# Patient Record
Sex: Female | Born: 2000 | Race: White | Hispanic: No | Marital: Single
Health system: Southern US, Academic
[De-identification: ages and names within clinical notes are randomized; demographics above are authoritative.]

## PROBLEM LIST (undated history)

## (undated) HISTORY — PX: APPENDECTOMY: SHX54

---

## 2020-07-17 ENCOUNTER — Emergency Department (HOSPITAL_COMMUNITY)
Admission: EM | Admit: 2020-07-17 | Discharge: 2020-07-18 | Disposition: A | Discharge status: Home / Self Care | Payer: BLUE CROSS/BLUE SHIELD | Attending: Emergency Medicine | Admitting: Emergency Medicine

## 2020-07-17 ENCOUNTER — Encounter (HOSPITAL_COMMUNITY): Payer: Self-pay

## 2020-07-17 ENCOUNTER — Emergency Department (HOSPITAL_COMMUNITY): Payer: BLUE CROSS/BLUE SHIELD

## 2020-07-17 DIAGNOSIS — R519 Headache, unspecified: Secondary | ICD-10-CM | POA: Insufficient documentation

## 2020-07-17 DIAGNOSIS — R590 Localized enlarged lymph nodes: Secondary | ICD-10-CM | POA: Diagnosis not present

## 2020-07-17 DIAGNOSIS — Z20822 Contact with and (suspected) exposure to covid-19: Secondary | ICD-10-CM | POA: Insufficient documentation

## 2020-07-17 DIAGNOSIS — M542 Cervicalgia: Secondary | ICD-10-CM | POA: Insufficient documentation

## 2020-07-17 DIAGNOSIS — J029 Acute pharyngitis, unspecified: Secondary | ICD-10-CM | POA: Insufficient documentation

## 2020-07-17 LAB — RESP PANEL BY RT-PCR (FLU A&B, COVID) ARPGX2
Influenza A by PCR: NEGATIVE
Influenza B by PCR: NEGATIVE
SARS Coronavirus 2 by RT PCR: NEGATIVE

## 2020-07-17 LAB — CBC WITH DIFFERENTIAL/PLATELET
Abs Immature Granulocytes: 0.01 10*3/uL (ref 0.00–0.07)
Basophils Absolute: 0.1 10*3/uL (ref 0.0–0.1)
Basophils Relative: 1 %
Eosinophils Absolute: 0.2 10*3/uL (ref 0.0–0.5)
Eosinophils Relative: 3 %
HCT: 36.5 % (ref 36.0–46.0)
Hemoglobin: 11.5 g/dL — ABNORMAL LOW (ref 12.0–15.0)
Immature Granulocytes: 0 %
Lymphocytes Relative: 38 %
Lymphs Abs: 2.2 10*3/uL (ref 0.7–4.0)
MCH: 26.5 pg (ref 26.0–34.0)
MCHC: 31.5 g/dL (ref 30.0–36.0)
MCV: 84.1 fL (ref 80.0–100.0)
Monocytes Absolute: 0.6 10*3/uL (ref 0.1–1.0)
Monocytes Relative: 10 %
Neutro Abs: 2.8 10*3/uL (ref 1.7–7.7)
Neutrophils Relative %: 48 %
Platelets: 296 10*3/uL (ref 150–400)
RBC: 4.34 MIL/uL (ref 3.87–5.11)
RDW: 12.1 % (ref 11.5–15.5)
WBC: 5.7 10*3/uL (ref 4.0–10.5)
nRBC: 0 % (ref 0.0–0.2)

## 2020-07-17 LAB — URINALYSIS, ROUTINE W REFLEX MICROSCOPIC
Bilirubin Urine: NEGATIVE
Glucose, UA: NEGATIVE mg/dL
Hgb urine dipstick: NEGATIVE
Ketones, ur: NEGATIVE mg/dL
Nitrite: NEGATIVE
Protein, ur: NEGATIVE mg/dL
Specific Gravity, Urine: 1.008 (ref 1.005–1.030)
pH: 6 (ref 5.0–8.0)

## 2020-07-17 LAB — BASIC METABOLIC PANEL
Anion gap: 10 (ref 5–15)
BUN: 12 mg/dL (ref 6–20)
CO2: 25 mmol/L (ref 22–32)
Calcium: 9.7 mg/dL (ref 8.9–10.3)
Chloride: 103 mmol/L (ref 98–111)
Creatinine, Ser: 0.79 mg/dL (ref 0.44–1.00)
GFR, Estimated: >60 mL/min (ref >60)
Glucose, Bld: 96 mg/dL (ref 70–99)
Potassium: 4 mmol/L (ref 3.5–5.1)
Sodium: 138 mmol/L (ref 135–145)

## 2020-07-17 LAB — I-STAT BETA HCG BLOOD, ED (MC, WL, AP ONLY): I-stat hCG, quantitative: <5 m[IU]/mL (ref <5)

## 2020-07-17 MED ORDER — IOHEXOL 300 MG/ML  SOLN
75.0000 mL | Freq: Once | INTRAMUSCULAR | Status: AC | PRN
  Administered 2020-07-17: 75 mL via INTRAVENOUS

## 2020-07-17 MED ORDER — ACETAMINOPHEN 325 MG PO TABS
650.0000 mg | ORAL_TABLET | Freq: Once | ORAL | Status: AC
  Administered 2020-07-17: 650 mg via ORAL
  Filled 2020-07-17: qty 2

## 2020-07-17 NOTE — ED Provider Notes (Signed)
[facility] EMERGENCY DEPARTMENT Provider Note   CSN: [id number] Arrival date & time: 07/17/20  1911     History Chief Complaint  Patient presents with  . Fever  . Neck Pain    Desiree Johnson is a 19 y.o. female.  HPI Patient presents with sore throat headache and neck pain.  Reportedly over the last 2 weeks to a month has been dealing with sore throats.  Due to get her tonsils out in the month.  States that has been on 3 different antibiotics.  Has been on azithromycin cefdinir and now clindamycin.  Has been tested for Covid that was negative and reportedly tested for mono with that they initially thought was positive and then he said was a previous infection.  Sore throat pain or swelling.  Now states she is having more of a headache and sort of pain in her neck.  Some pain moving her neck.  States she talked to her ENT and was told to come in to rule out meningitis.  States she is been having fevers for 2 weeks.  States it will be anywhere from 99 up to almost 100.  Denies temperature over 100.5.  States the pain is hurting more to swallow.  Patient states she also had had an ultrasound done due to some swelling in her abdomen.  Reportedly also was positive for group C strep.    History reviewed. No pertinent past medical history.  There are no problems to display for this patient.   Past Surgical History:  Procedure Laterality Date  . APPENDECTOMY       OB History   No obstetric history on file.     No family history on file.  Social History   Tobacco Use  . Smoking status: Not on file  Substance Use Topics  . Alcohol use: Not on file  . Drug use: Not on file    Home Medications Prior to Admission medications   Medication Sig Start Date End Date Taking? Authorizing Provider  Adapalene-Benzoyl Peroxide 0.1-2.5 % gel Apply topically daily. 04/28/20   [provider]  cefdinir (OMNICEF) 300 MG capsule TAKE ONE CAPSULE BY MOUTH TWICE DAILY  FOR SEVEN DAYS 06/30/20   [provider]  clindamycin (CLEOCIN) 150 MG capsule Take 150 mg by mouth every 8 (eight) hours. 07/11/20   [provider]  LO LOESTRIN FE 1 MG-10 MCG / 10 MCG tablet Take 1 tablet by mouth daily. 06/24/20   [provider]    Allergies    Latex  Review of Systems   Review of Systems  Constitutional: Positive for appetite change, fatigue and fever.  HENT: Positive for sore throat. Negative for congestion and voice change.        Pain with swallowing.  Respiratory: Negative for shortness of breath.   Cardiovascular: Negative for leg swelling.  Gastrointestinal: Negative for abdominal pain.  Genitourinary: Negative for flank pain.  Musculoskeletal: Negative for back pain.  Skin: Negative for rash.  Neurological: Positive for headaches. Negative for weakness and numbness.  Psychiatric/Behavioral: Negative for confusion.    Physical Exam Updated Vital Signs BP 116/73   Pulse 77   Temp 98.1 F (36.7 C)   Resp 17   SpO2 98%   Physical Exam Vitals reviewed.  Constitutional:      Appearance: Normal appearance.  HENT:     Head: Normocephalic.     Mouth/Throat:     Mouth: Mucous membranes are moist.  Comments: Bilateral tonsils swollen.  No peritonsillar swelling.  Normal voice.  Uvula midline. Eyes:     Extraocular Movements: Extraocular movements intact.  Cardiovascular:     Rate and Rhythm: Normal rate and regular rhythm.  Pulmonary:     Breath sounds: No wheezing or rhonchi.  Abdominal:     Tenderness: There is no abdominal tenderness.  Musculoskeletal:        General: No tenderness.     Cervical back: Normal range of motion and neck supple. No rigidity.  Lymphadenopathy:     Cervical: Cervical adenopathy present.  Skin:    General: Skin is warm.     Capillary Refill: Capillary refill takes less than 2 seconds.  Neurological:     Mental Status: She is alert and oriented to person, place, and time.    Psychiatric:        Mood and Affect: Mood normal.     ED Results / Procedures / Treatments   Labs (all labs ordered are listed, but only abnormal results are displayed) Labs Reviewed  CBC WITH DIFFERENTIAL/PLATELET - Abnormal; Notable for the following components:      Result Value   Hemoglobin 11.5 (*)    All other components within normal limits  URINALYSIS, ROUTINE W REFLEX MICROSCOPIC - Abnormal; Notable for the following components:   APPearance HAZY (*)    Leukocytes,Ua TRACE (*)    Bacteria, UA MANY (*)    All other components within normal limits  RESP PANEL BY RT-PCR (FLU A&B, COVID) ARPGX2  BASIC METABOLIC PANEL  I-STAT BETA HCG BLOOD, ED ([facility], [facility], [facility] ONLY)    EKG None  Radiology CT Soft Tissue Neck W Contrast  Result Date: 07/17/2020 CLINICAL DATA:  Fever with neck stiffness. EXAM: CT NECK WITH CONTRAST TECHNIQUE: Multidetector CT imaging of the neck was performed using the standard protocol following the bolus administration of intravenous contrast. CONTRAST:  4mL OMNIPAQUE IOHEXOL 300 MG/ML  SOLN COMPARISON:  None. FINDINGS: PHARYNX AND LARYNX: The nasopharynx, oropharynx and larynx are normal. Visible portions of the oral cavity, tongue base and floor of mouth are normal. Normal epiglottis, vallecula and pyriform sinuses. The larynx is normal. No retropharyngeal abscess, effusion or lymphadenopathy. SALIVARY GLANDS: Normal parotid, submandibular and sublingual glands. THYROID: Normal. LYMPH NODES: 10 mm bilateral level 2A lymph nodes. VASCULAR: Major cervical vessels are patent. LIMITED INTRACRANIAL: Normal. VISUALIZED ORBITS: Normal. MASTOIDS AND VISUALIZED PARANASAL SINUSES: No fluid levels or advanced mucosal thickening. No mastoid effusion. SKELETON: No bony spinal canal stenosis. No lytic or blastic lesions. UPPER CHEST: Clear. OTHER: None. IMPRESSION: 1. No acute abnormality of the neck. 2. Mildly enlarged bilateral level 2A lymph nodes, likely reactive.  Electronically Signed   By: Deatra Robinson M.D.   On: 07/17/2020 23:41    Procedures Procedures (including critical care time)  Medications Ordered in ED Medications  acetaminophen (TYLENOL) tablet 650 mg (650 mg Oral Given 07/17/20 2251)  iohexol (OMNIPAQUE) 300 MG/ML solution 75 mL (75 mLs Intravenous Contrast Given 07/17/20 2318)  dexamethasone (DECADRON) injection 10 mg (10 mg Intravenous Given 07/18/20 0014)    ED Course  I have reviewed the triage vital signs and the nursing notes.  Pertinent labs & imaging results that were available during my care of the patient were reviewed by me and considered in my medical decision making (see chart for details).    MDM Rules/Calculators/A&P  Patient sent in.  Has had sore throat for a while now.  Has been on and off antibiotics.  Due to have tonsils out.  Has had previous testing.  Reportedly positive for group C strep.  Reportedly sent in to rule out meningitis.  Patient has good motion at the neck.  No fever here.  No abnormal white count.  CT scan of the neck done due to lymph nodes continued pain.  Reassuring.  No peritonsillar abscess.  Clinically I do not think patient has a bacterial meningitis and I think also unlikely to have viral meningitis.  Reviewed previous mono testing and I think it was most likely consistent with previous infection.  Has not been treated with steroids.  Do not think we need to stop the antibiotics but will add some Decadron here.  Discharge home. Urine has some white cells and bacteria.  Has been urinating frequently but states she has done that her whole life with no change now.  Without new symptoms do not think we need treatment for that.  No abdominal tenderness.  Discharge home.  Final Clinical Impression(s) / ED Diagnoses Final diagnoses:  None    Rx / DC Orders ED Discharge Orders    None       Benjiman Core, MD 07/18/20 0021

## 2020-07-17 NOTE — ED Triage Notes (Addendum)
Pt arrives to ED w/ c/o fever, stiff neck, and headache x 2 weeks. Pt has chronic throat infections d/t tonsils and has been on ABX during this time, however, told by ENT to come to ER for meningitis workup. Pt states she had positive group C strep test 3 weeks ago. Pt reports 6/10 head/neck pain.

## 2020-07-18 MED ORDER — DEXAMETHASONE SODIUM PHOSPHATE 10 MG/ML IJ SOLN
10.0000 mg | Freq: Once | INTRAMUSCULAR | Status: AC
  Administered 2020-07-18: 10 mg via INTRAVENOUS
  Filled 2020-07-18: qty 1

## 2020-07-18 NOTE — ED Notes (Signed)
Discharge instructions provided to patient. Verbalized understanding. Alert and oriented. IV lock removed. Ambulated with steady gait with mother out of ED.

## 2020-07-18 NOTE — Discharge Instructions (Addendum)
Hopefully the steroids will help with some of the symptoms.  Follow-up with ear nose and throat.  You are afebrile here and have normal white count without a left shift.  Bacterial meningitis thought unlikely.

## 2022-01-16 IMAGING — CT CT NECK W/ CM
4 series · 15 of 33 positions shown, 18 images · IV contrast (APPLIED)
Comparison: None.

CLINICAL DATA: Fever with neck stiffness.

EXAM:
CT NECK WITH CONTRAST
TECHNIQUE: Multidetector CT imaging of the neck was performed using the
standard protocol following the bolus administration of intravenous
contrast.
CONTRAST:  75mL OMNIPAQUE IOHEXOL 300 MG/ML  SOLN

[Series 3: neck 2.0 i31s 3 · axial · 0.49mm/px · z∈[-202,-52]mm · 5 of 113 slices shown, 7 images]
[im 19/113  soft-tissue]
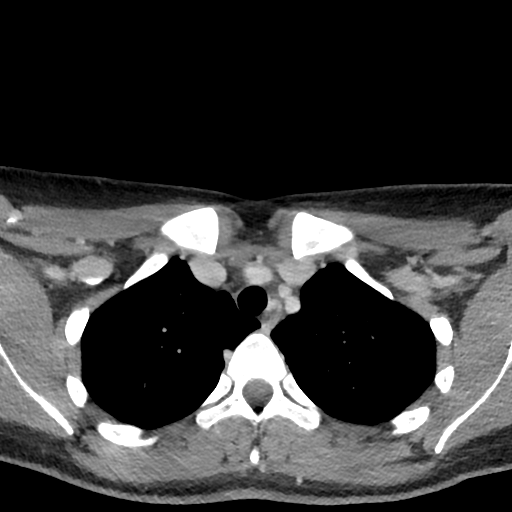
[im 19/113  bone]
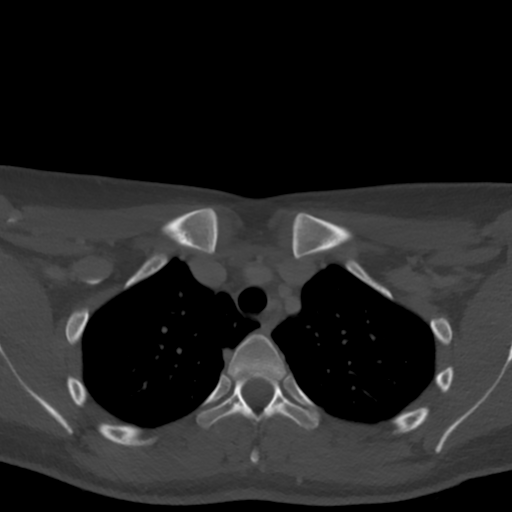
[im 38/113  bone]
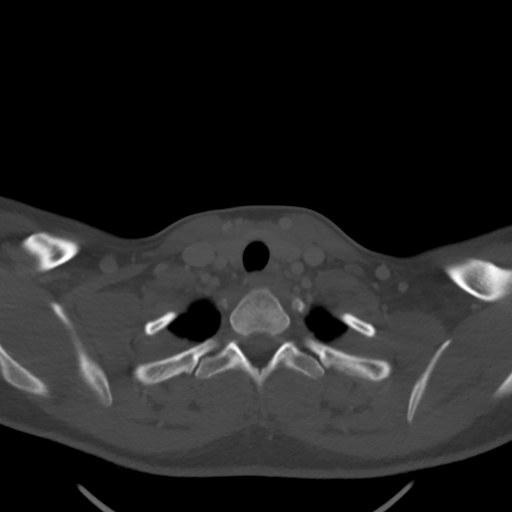
[im 57/113  bone]
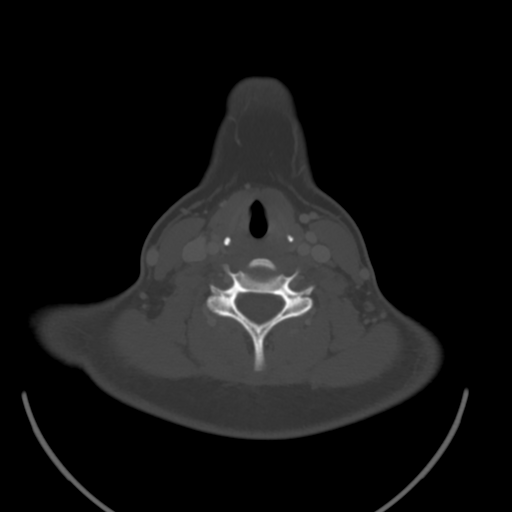
[im 75/113  bone]
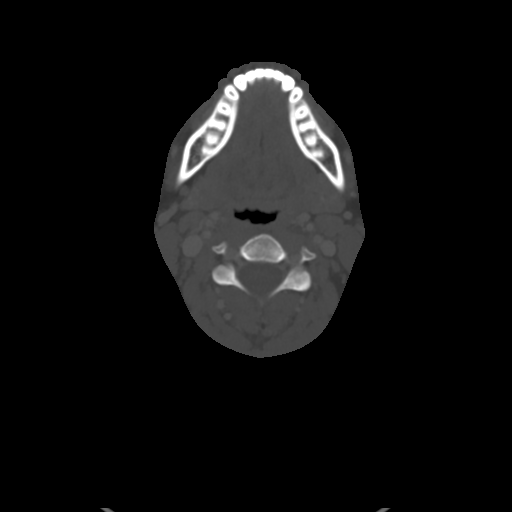
[im 94/113  soft-tissue]
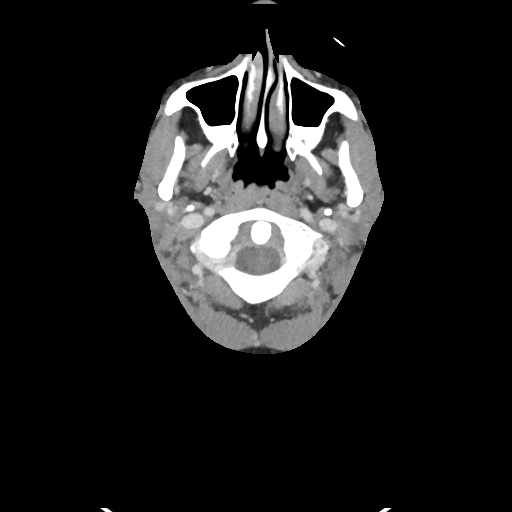
[im 94/113  bone]
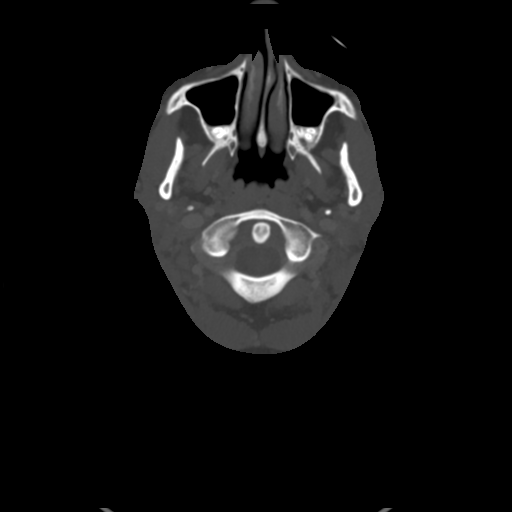

[Series 6: coronal st · coronal · 0.44mm/px · 3 of 122 slices shown]
[im 25/122  bone]
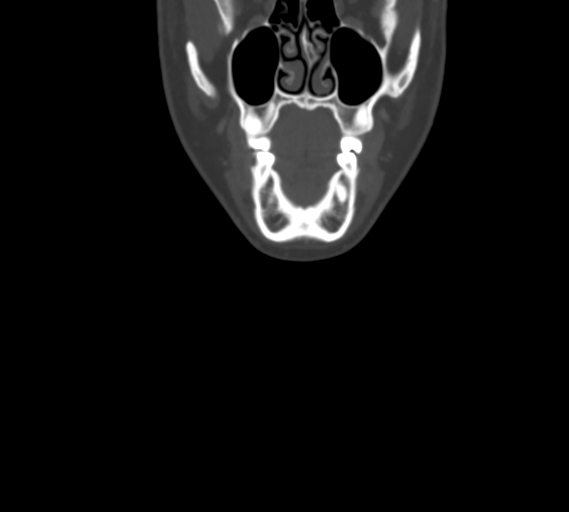
[im 49/122  bone]
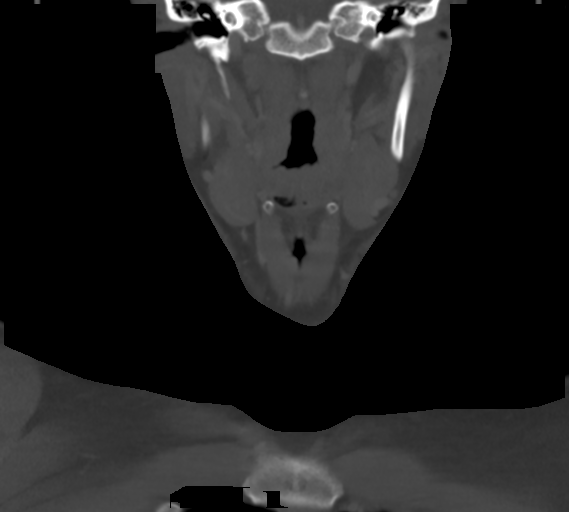
[im 73/122  bone]
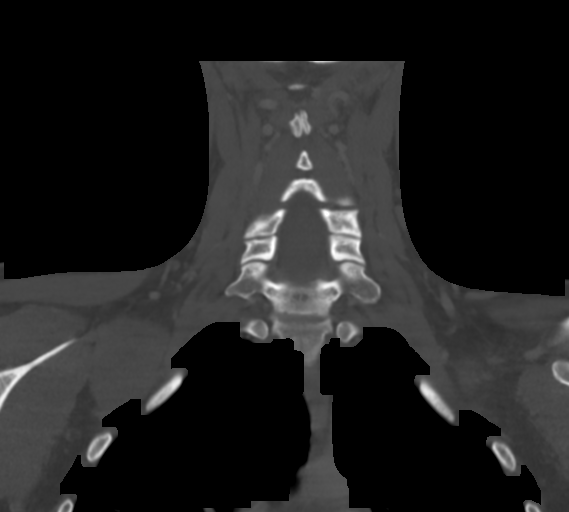

[Series 7: sagittal st · sagittal · 0.44mm/px · 5 of 101 slices shown, 6 images]
[im 34/101  bone]
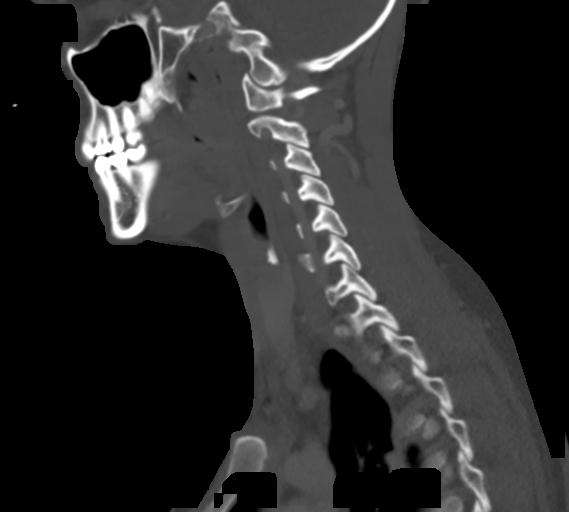
[im 42/101  bone]
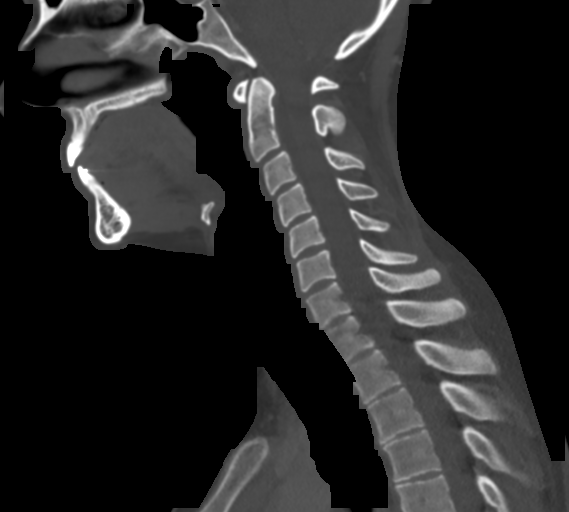
[im 51/101  soft-tissue]
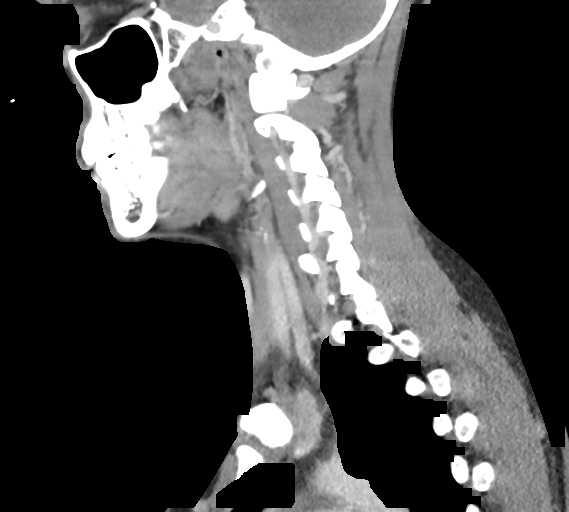
[im 51/101  bone]
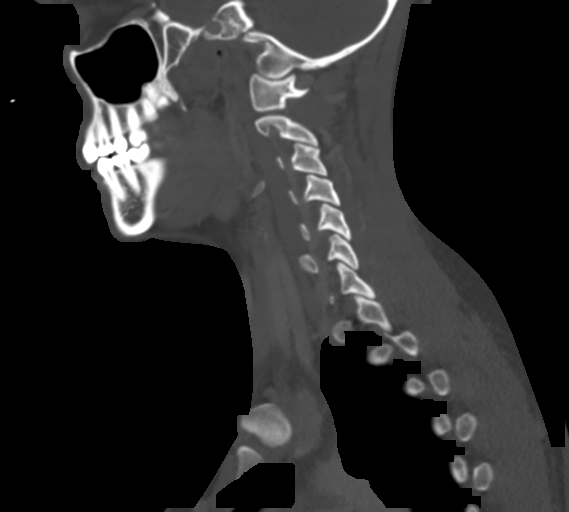
[im 59/101  bone]
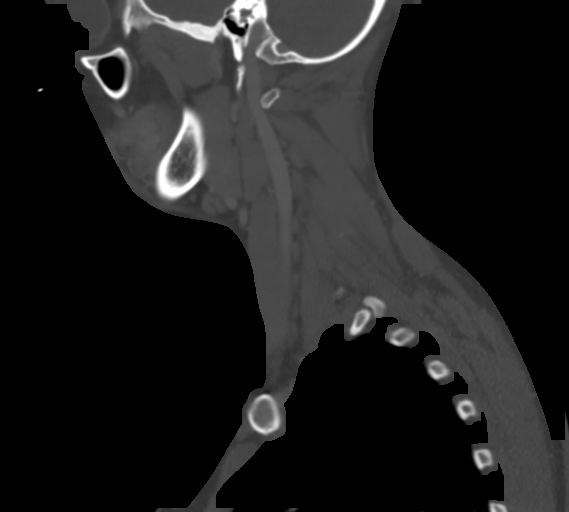
[im 67/101  bone]
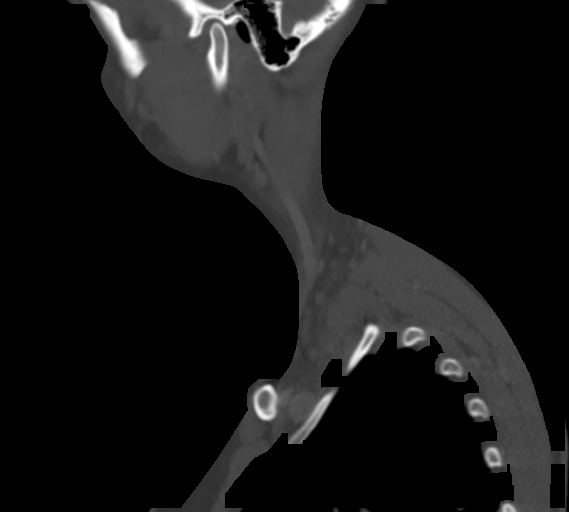

[Series 8: orthogonal st · axial · 0.39mm/px · z∈[-203,-165]mm · 2 of 114 slices shown]
[im 19/114  bone]
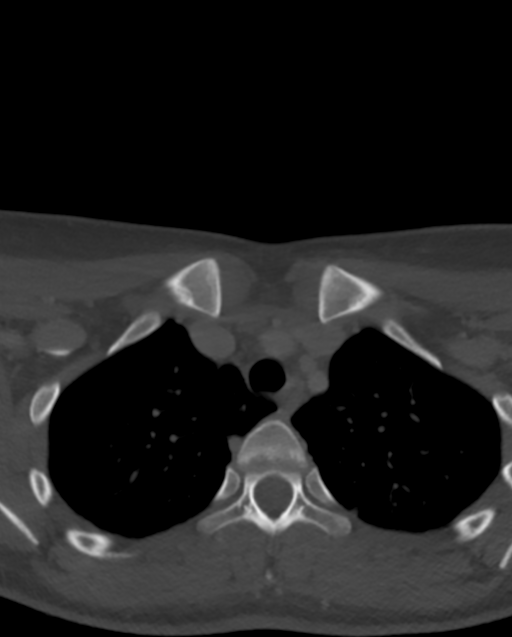
[im 38/114  bone]
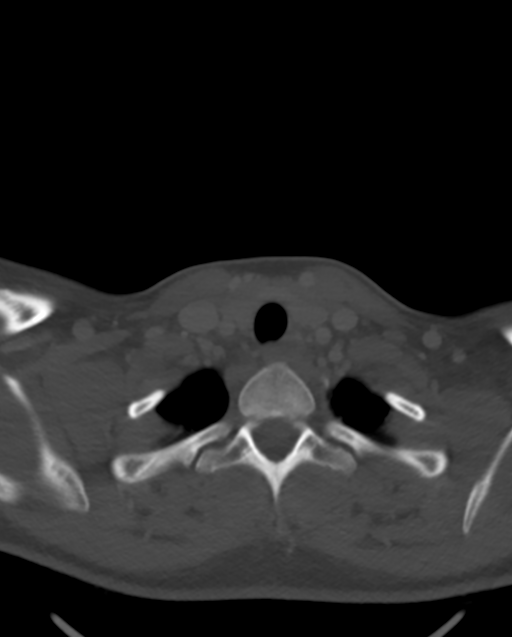

[15 of 33 positions shown; findings below may reference images not displayed]

FINDINGS: PHARYNX AND LARYNX: The nasopharynx, oropharynx and larynx are
normal. Visible portions of the oral cavity, tongue base and floor
of mouth are normal. Normal epiglottis, vallecula and pyriform
sinuses. The larynx is normal. No retropharyngeal abscess, effusion
or lymphadenopathy.

SALIVARY GLANDS: Normal parotid, submandibular and sublingual
glands.

THYROID: Normal.

LYMPH NODES: 10 mm bilateral level 2A lymph nodes.

VASCULAR: Major cervical vessels are patent.

LIMITED INTRACRANIAL: Normal.

VISUALIZED ORBITS: Normal.

MASTOIDS AND VISUALIZED PARANASAL SINUSES: No fluid levels or
advanced mucosal thickening. No mastoid effusion.

SKELETON: No bony spinal canal stenosis. No lytic or blastic
lesions.

UPPER CHEST: Clear.

OTHER: None.
IMPRESSION: 1. No acute abnormality of the neck.
2. Mildly enlarged bilateral level 2A lymph nodes, likely reactive.

## 2022-02-04 ENCOUNTER — Other Ambulatory Visit: Payer: Self-pay

## 2022-02-04 DIAGNOSIS — W19XXXA Unspecified fall, initial encounter: Secondary | ICD-10-CM

## 2022-02-04 DIAGNOSIS — S82832A Other fracture of upper and lower end of left fibula, initial encounter for closed fracture: Secondary | ICD-10-CM

## 2022-02-04 DIAGNOSIS — S82392A Other fracture of lower end of left tibia, initial encounter for closed fracture: Secondary | ICD-10-CM

## 2022-02-04 NOTE — ED Triage Notes (Signed)
"  I was wrestling around and come down on my ankle wrong" at 2200, left ankle pain/edema, unable to bear weight on LLE, verb "I've been drinking some today"

## 2022-02-05 ENCOUNTER — Emergency Department (HOSPITAL_COMMUNITY): Payer: BC Managed Care – PPO

## 2022-02-05 ENCOUNTER — Encounter (HOSPITAL_COMMUNITY): Payer: Self-pay

## 2022-02-05 ENCOUNTER — Emergency Department
Admission: EM | Admit: 2022-02-05 | Discharge: 2022-02-05 | Disposition: A | Discharge status: Home / Self Care | Payer: BC Managed Care – PPO | Attending: Physician Assistant | Admitting: Physician Assistant

## 2022-02-05 DIAGNOSIS — S82832A Other fracture of upper and lower end of left fibula, initial encounter for closed fracture: Secondary | ICD-10-CM | POA: Insufficient documentation

## 2022-02-05 DIAGNOSIS — S82309A Unspecified fracture of lower end of unspecified tibia, initial encounter for closed fracture: Secondary | ICD-10-CM

## 2022-02-05 DIAGNOSIS — S82392A Other fracture of lower end of left tibia, initial encounter for closed fracture: Secondary | ICD-10-CM | POA: Insufficient documentation

## 2022-02-05 DIAGNOSIS — W19XXXA Unspecified fall, initial encounter: Secondary | ICD-10-CM | POA: Insufficient documentation

## 2022-02-05 MED ORDER — IBUPROFEN 800 MG TABLET
800.0000 mg | ORAL_TABLET | ORAL | Status: AC
Start: 2022-02-05 — End: 2022-02-05
  Administered 2022-02-05: 800 mg via ORAL

## 2022-02-05 MED ORDER — HYDROCODONE 5 MG-ACETAMINOPHEN 325 MG TABLET
1.0000 | ORAL_TABLET | ORAL | 0 refills | Status: AC | PRN
Start: 2022-02-05 — End: 2022-02-08

## 2022-02-05 MED ORDER — IBUPROFEN 800 MG TABLET
ORAL_TABLET | ORAL | Status: AC
Start: 2022-02-05 — End: 2022-02-05
  Filled 2022-02-05: qty 1

## 2022-02-05 NOTE — Discharge Instructions (Signed)
You did sustain a fracture to your left tibia/fibula.  Keep the splint in place keep the leg elevated as much as possible.  A prescription for pain medication was written get this filled and take as needed.  Push fluids take rest.  It is very important that you follow-up with an orthopedist as soon as possible.  Use crutches to get around do not put any weight on the ankle.  If you experiences significant increase in pain swelling numbness tingling or other concerns return to the nearest emergency department as soon as possible

## 2022-02-05 NOTE — ED Nurses Note (Signed)
Posterior + Stirrup splint applie to lle by staff at this time. Strong Pedal pulse palpated in extremity after placement.

## 2022-02-05 NOTE — ED Provider Notes (Signed)
[facility]  ED Primary Provider Note  History of Present Illness   Chief Complaint   Patient presents with    Ankle Injury     Margaret Bender is a 21 y.o. female who had concerns including Ankle Injury.  Arrival: The patient arrived by Car    Patient is a 21 year old female no significant past medical history presents emergency department after sustaining injury to her left ankle.  Patient states that she was wrestling was thrown down now has pain.  States he has been drinking this evening.  She is from out of town.      Review of Systems   Pertinent positive and negative ROS as per HPI.  Historical Data   History Reviewed This Encounter: Medical History  Surgical History  Family History  Social History      Physical Exam   ED Triage Vitals [02/04/22 2358]   BP (Non-Invasive) 127/84   Heart Rate 93   Respiratory Rate 18   Temperature 37.4 C (99.3 F)   SpO2 100 %   Weight 68 kg (150 lb)   Height 1.626 m (5\' 4" )     Physical Exam  Constitutional:       Appearance: Normal appearance.   HENT:      Head: Normocephalic.      Mouth/Throat:      Mouth: Mucous membranes are dry.   Eyes:      Extraocular Movements: Extraocular movements intact.      Pupils: Pupils are equal, round, and reactive to light.   Cardiovascular:      Rate and Rhythm: Normal rate and regular rhythm.      Pulses: Normal pulses.      Heart sounds: Normal heart sounds.   Pulmonary:      Effort: Pulmonary effort is normal.      Breath sounds: Normal breath sounds.   Abdominal:      General: Abdomen is flat.      Palpations: Abdomen is soft.   Musculoskeletal:      Comments: Swelling and tenderness to the left ankle pulses are intact calf soft   Neurological:      Mental Status: She is alert.     Patient Data   Labs Ordered/Reviewed - No data to display  XR FOOT LEFT 2 VIEW   Final Result by Edi, Radresults In (06/23 0136)   NEGATIVE FOOT SERIES                Radiologist location ID: [id number]         XR  TIBIA-FIBULA LEFT   Final Result by Edi, Radresults In (06/23 0140)   Comminuted fractures of the distal tibia and fibula. No additional fracture demonstrated.            Radiologist location ID: [id number]         XR ANKLE LEFT   Final Result by Edi, Radresults In (06/23 0132)   Nondisplaced fractures of the distal tibia and fibula as described above.         Radiologist location ID: [id number]           Medical Decision Making        Medical Decision Making  Plain films demonstrate comminuted distal tibia fracture and distal fibula fracture only mild displacement.  Discussed the case and reviewed the imaging with Dr.Higinbotham.  Patient wants to return home does not want to stay in the hospital overnight.  Dr. [id number] recommended discussing the  signs of compartment syndrome with the patient posterior and stirrup splinting.  She was given Motrin here given her drinking she is written a prescription for Lortab for tomorrow.  She is given a disc discussed the importance of follow-up with orthopedist as soon as possible    Risk  Prescription drug management.             Medications Administered in the ED   ibuprofen (MOTRIN) tablet (800 mg Oral Given 02/05/22 0125)     Clinical Impression   Closed fracture of distal tibia (Primary)       Disposition: Discharged

## 2022-03-03 ENCOUNTER — Other Ambulatory Visit: Payer: Self-pay | Admitting: Orthopaedic Surgery

## 2022-03-03 ENCOUNTER — Ambulatory Visit
Admission: RE | Admit: 2022-03-03 | Discharge: 2022-03-03 | Disposition: A | Discharge status: Home / Self Care | Payer: BC Managed Care – PPO | Source: Ambulatory Visit | Attending: Orthopaedic Surgery | Admitting: Orthopaedic Surgery

## 2022-03-03 DIAGNOSIS — S93402A Sprain of unspecified ligament of left ankle, initial encounter: Secondary | ICD-10-CM

## 2022-03-03 DIAGNOSIS — M79605 Pain in left leg: Secondary | ICD-10-CM

## 2022-03-18 ENCOUNTER — Encounter (HOSPITAL_COMMUNITY): Payer: Self-pay | Admitting: Physician Assistant
